# Patient Record
Sex: Female | Born: 2000 | Race: White | Hispanic: No | Marital: Single | Smoking: Never smoker
Health system: Southern US, Community
[De-identification: ages and names within clinical notes are randomized; demographics above are authoritative.]

## PROBLEM LIST (undated history)

## (undated) DIAGNOSIS — IMO0002 Reserved for concepts with insufficient information to code with codable children: Secondary | ICD-10-CM

## (undated) DIAGNOSIS — Z98811 Dental restoration status: Secondary | ICD-10-CM

## (undated) HISTORY — PX: TYMPANOSTOMY TUBE PLACEMENT: SHX32

## (undated) HISTORY — PX: ADENOIDECTOMY: SUR15

---
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
## 2018-09-16 ENCOUNTER — Other Ambulatory Visit: Payer: Self-pay | Admitting: Family Medicine

## 2018-09-16 ENCOUNTER — Ambulatory Visit
Admission: RE | Admit: 2018-09-16 | Discharge: 2018-09-16 | Disposition: A | Discharge status: Home / Self Care | Payer: BLUE CROSS/BLUE SHIELD | Source: Ambulatory Visit | Attending: Family Medicine | Admitting: Family Medicine

## 2018-09-16 DIAGNOSIS — R05 Cough: Secondary | ICD-10-CM | POA: Diagnosis not present

## 2018-09-16 DIAGNOSIS — R0602 Shortness of breath: Secondary | ICD-10-CM

## 2018-09-16 DIAGNOSIS — Z6823 Body mass index (BMI) 23.0-23.9, adult: Secondary | ICD-10-CM | POA: Diagnosis not present

## 2018-09-16 DIAGNOSIS — R509 Fever, unspecified: Secondary | ICD-10-CM

## 2018-09-16 DIAGNOSIS — Z9189 Other specified personal risk factors, not elsewhere classified: Secondary | ICD-10-CM | POA: Diagnosis not present

## 2018-09-16 DIAGNOSIS — J069 Acute upper respiratory infection, unspecified: Secondary | ICD-10-CM | POA: Diagnosis not present

## 2018-09-16 DIAGNOSIS — J189 Pneumonia, unspecified organism: Secondary | ICD-10-CM | POA: Diagnosis not present

## 2018-09-16 DIAGNOSIS — B2790 Infectious mononucleosis, unspecified without complication: Secondary | ICD-10-CM | POA: Diagnosis not present

## 2018-09-16 IMAGING — DX DG CHEST 2V
2 series · 2 of 2 positions shown · non-contrast
Comparison: [DATE]

CLINICAL DATA: Shortness of breath with cough

EXAM:
CHEST - 2 VIEW

[dg chest 2 view (1 of 2)]
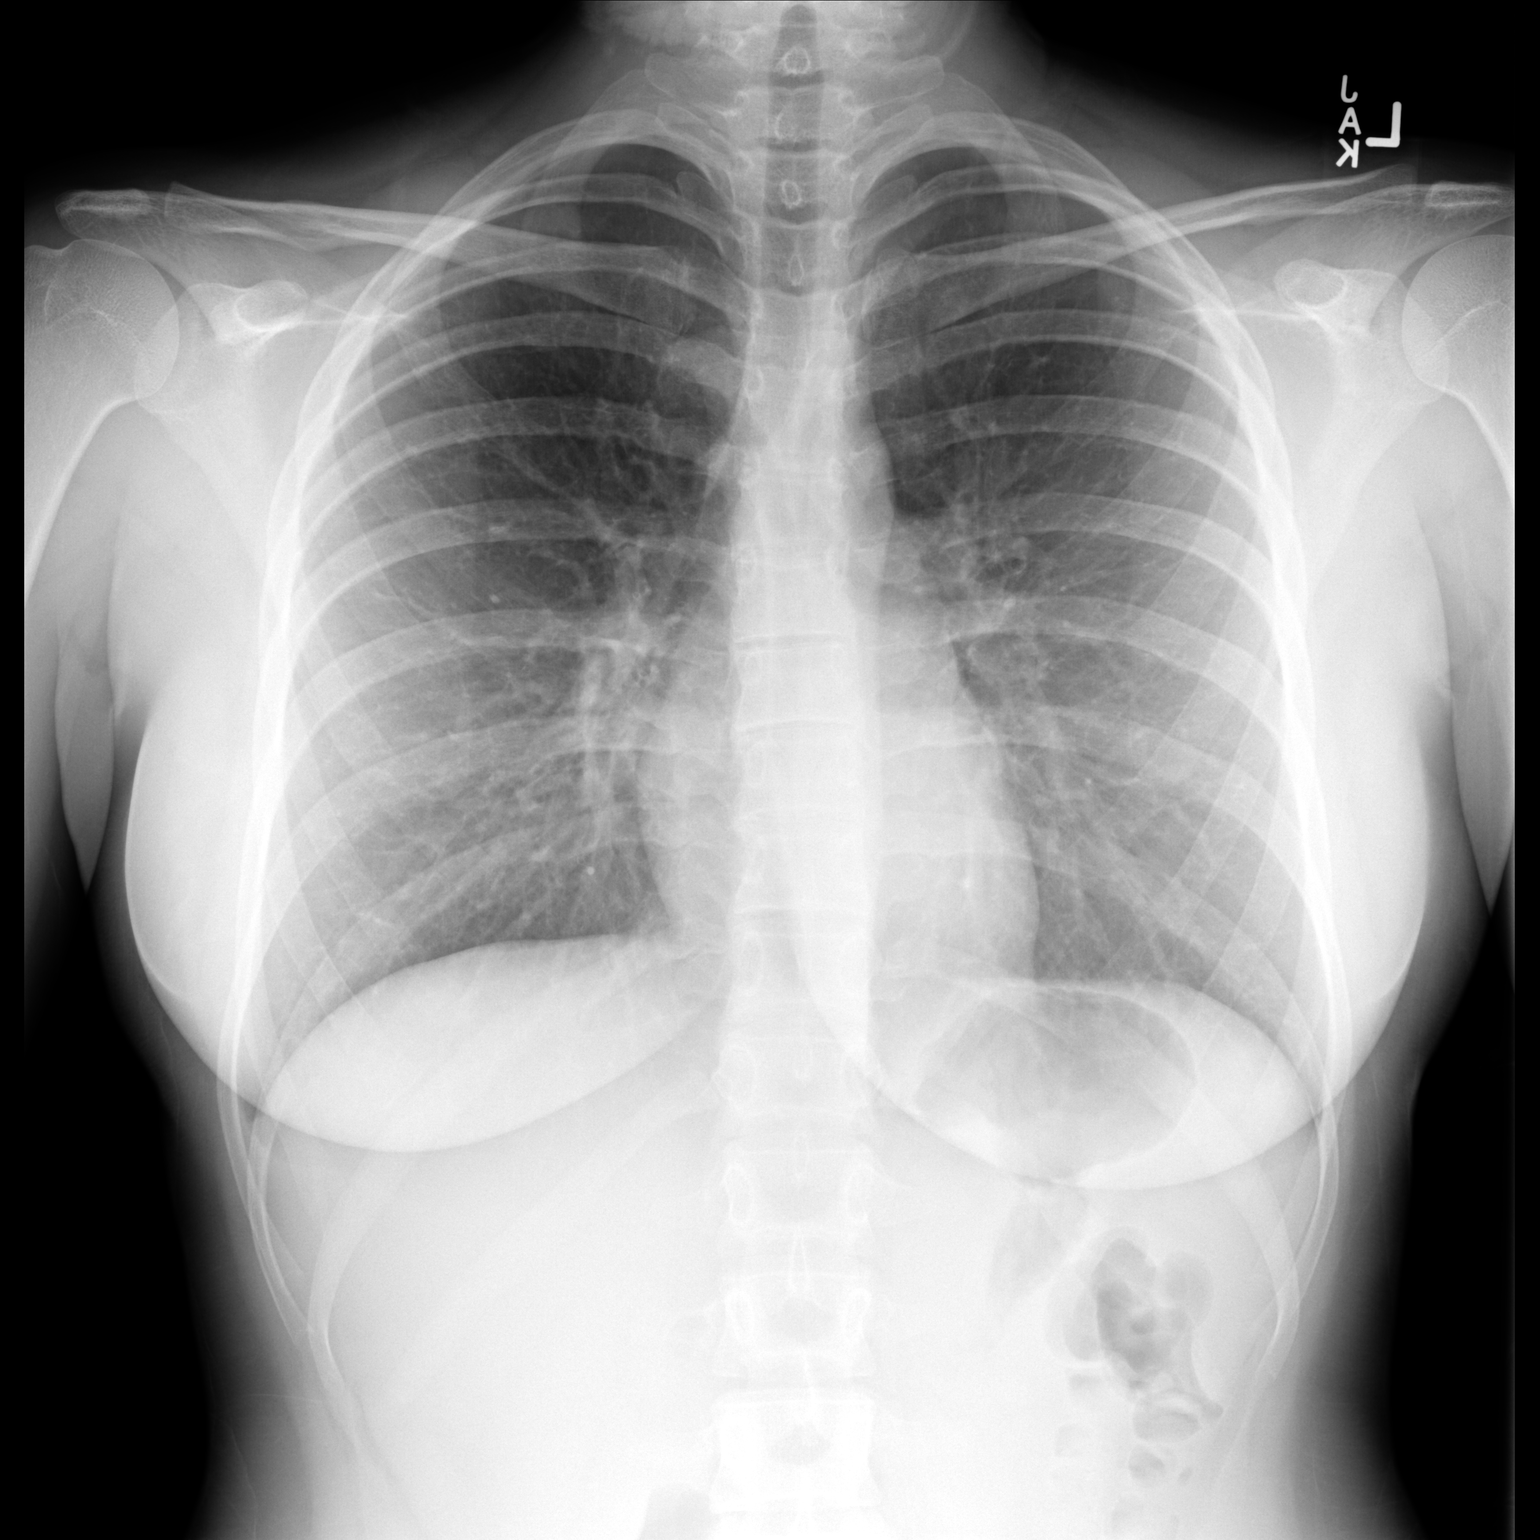

[dg chest 2 view (2 of 2)]
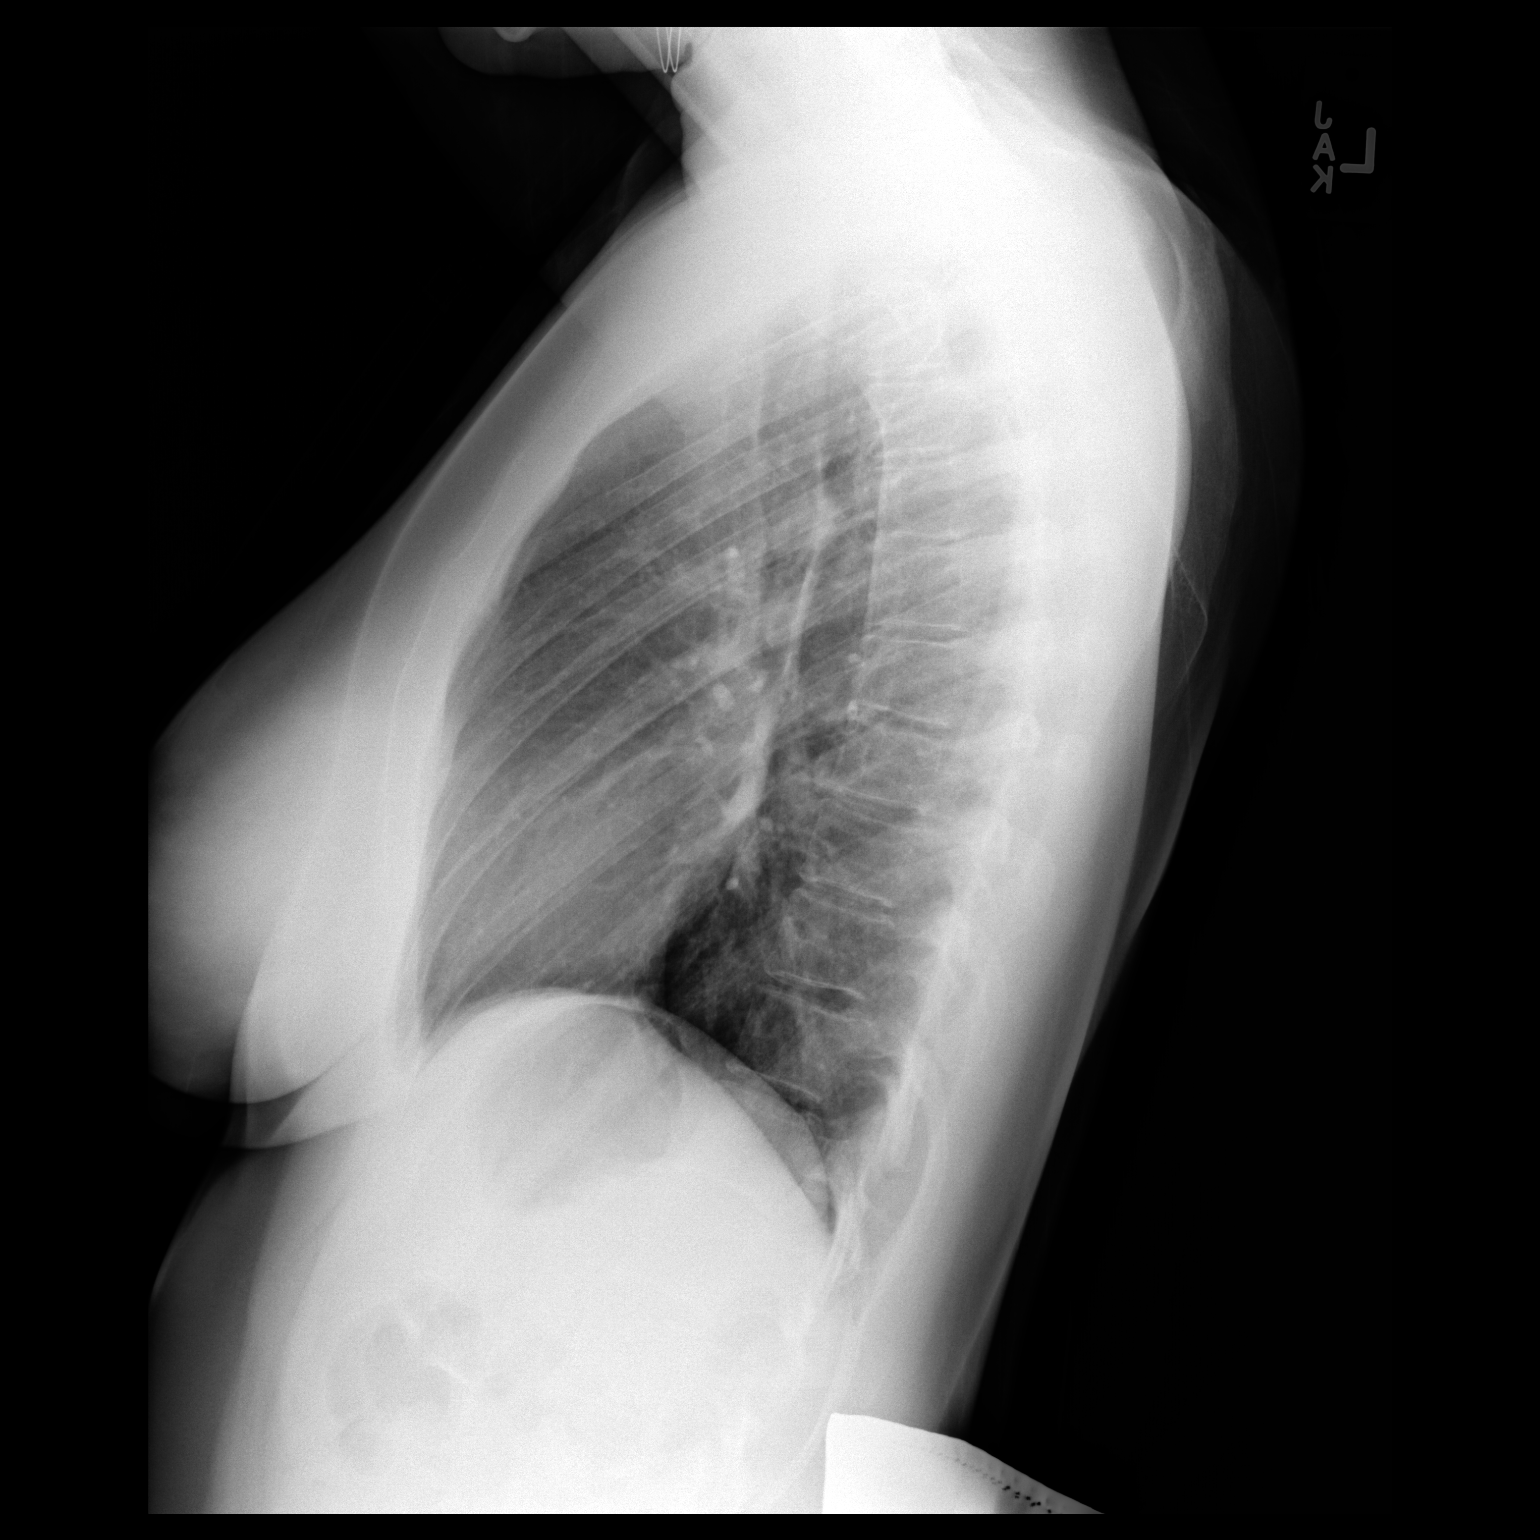

[2 of 2 positions shown; findings below may reference images not displayed]

FINDINGS: The lungs are clear. The heart size and pulmonary vascularity are
normal. No adenopathy. No evident bone lesions.
IMPRESSION: No edema or consolidation.

## 2018-09-16 MED ORDER — IOPAMIDOL (ISOVUE-370) INJECTION 76%
75.0000 mL | Freq: Once | INTRAVENOUS | Status: AC | PRN
  Administered 2018-09-16: 75 mL via INTRAVENOUS

## 2018-09-18 DIAGNOSIS — Z793 Long term (current) use of hormonal contraceptives: Secondary | ICD-10-CM | POA: Diagnosis not present

## 2018-09-18 DIAGNOSIS — B360 Pityriasis versicolor: Secondary | ICD-10-CM | POA: Diagnosis not present

## 2018-09-18 DIAGNOSIS — J189 Pneumonia, unspecified organism: Secondary | ICD-10-CM | POA: Diagnosis not present

## 2018-09-18 DIAGNOSIS — R5383 Other fatigue: Secondary | ICD-10-CM | POA: Diagnosis not present

## 2018-09-22 DIAGNOSIS — J189 Pneumonia, unspecified organism: Secondary | ICD-10-CM | POA: Diagnosis not present

## 2018-09-22 DIAGNOSIS — Z6823 Body mass index (BMI) 23.0-23.9, adult: Secondary | ICD-10-CM | POA: Diagnosis not present

## 2019-09-24 DIAGNOSIS — Z283 Underimmunization status: Secondary | ICD-10-CM | POA: Diagnosis not present

## 2019-09-24 DIAGNOSIS — L709 Acne, unspecified: Secondary | ICD-10-CM | POA: Diagnosis not present

## 2019-09-24 DIAGNOSIS — Z793 Long term (current) use of hormonal contraceptives: Secondary | ICD-10-CM | POA: Diagnosis not present

## 2022-12-16 NOTE — L&D Delivery Note (Signed)
Delivery Note At 10:25 AM a viable female was delivered via Vaginal, Spontaneous (Presentation:   Occiput Anterior).  APGAR: 8, 9; weight pending.  Placenta status: Spontaneous, Intact.  Cord: 3 vessels with the following complications: loose nuchal x 1; reduced.  Cord pH: n/a  Anesthesia: Epidural Episiotomy:  n/a Lacerations:  bilateral periurethral and vaginal Suture Repair: 3.0 vicryl rapide Est. Blood Loss (mL):  211  Mom to postpartum.  Baby to Couplet care / Skin to Skin.  Stacey Avila 12/02/2023, 10:46 AM

## 2023-05-16 LAB — OB RESULTS CONSOLE HEPATITIS B SURFACE ANTIGEN: Hepatitis B Surface Ag: NEGATIVE

## 2023-05-16 LAB — OB RESULTS CONSOLE RUBELLA ANTIBODY, IGM: Rubella: IMMUNE

## 2023-05-16 LAB — OB RESULTS CONSOLE HIV ANTIBODY (ROUTINE TESTING): HIV: NONREACTIVE

## 2023-05-19 LAB — HEPATITIS C ANTIBODY: HCV Ab: NEGATIVE

## 2023-06-02 LAB — OB RESULTS CONSOLE GC/CHLAMYDIA
Chlamydia: NEGATIVE
Neisseria Gonorrhea: NEGATIVE

## 2023-09-24 LAB — OB RESULTS CONSOLE RPR: RPR: NONREACTIVE

## 2023-09-24 LAB — OB RESULTS CONSOLE HIV ANTIBODY (ROUTINE TESTING): HIV: NONREACTIVE

## 2023-11-16 LAB — OB RESULTS CONSOLE GC/CHLAMYDIA
Chlamydia: NEGATIVE
Neisseria Gonorrhea: NEGATIVE

## 2023-11-28 NOTE — H&P (Signed)
Stacey Avila is a 22 y.o. female presenting for IOL s/s gestational HTN. Bps 140s/90s in the office x 2.  OB History   No obstetric history on file.    Past Medical History:  Diagnosis Date   Dental crowns present    Foreign body 09/2012   left hand   Past Surgical History:  Procedure Laterality Date   ADENOIDECTOMY     TYMPANOSTOMY TUBE PLACEMENT     x 2-3   Family History: family history includes Diabetes in her paternal grandfather; Heart disease in her maternal grandmother; Hypertension in her maternal grandmother; Hypothyroidism in her mother. Social History:  reports that she has never smoked. She has never used smokeless tobacco. She reports that she does not drink alcohol and does not use drugs.     Maternal Diabetes: No Genetic Screening: Normal Maternal Ultrasounds/Referrals: Normal Fetal Ultrasounds or other Referrals:  None Maternal Substance Abuse:  No Significant Maternal Medications:  None Significant Maternal Lab Results:  None Number of Prenatal Visits:greater than 3 verified prenatal visits   Review of Systems History   There were no vitals taken for this visit. Exam Physical Exam  (from office) NAD, A&O NWOB Abd soft, nondistended, gravid  Prenatal labs: ABO, Rh:   Antibody:   Rubella:   RPR:    HBsAg:    HIV:    GBS:     Assessment/Plan: 22 yo G1P0 @ 28 wga presenting for IOL s/s gHTN without severe features. Cervix unfavorable. Plan for cytotec followed by pitocin/AROM when more favorable.  GBS unknown - pending. Will go ahead and treat.    Madelaine Etienne Annette Liotta 11/28/2023, 1:02 PM

## 2023-12-01 ENCOUNTER — Inpatient Hospital Stay (HOSPITAL_COMMUNITY)
Admission: RE | Admit: 2023-12-01 | Discharge: 2023-12-03 | DRG: 807 | Disposition: A | Discharge status: Home / Self Care | Payer: BC Managed Care – PPO | Attending: Obstetrics & Gynecology | Admitting: Obstetrics & Gynecology

## 2023-12-01 ENCOUNTER — Inpatient Hospital Stay (HOSPITAL_COMMUNITY): Admission: RE | Admit: 2023-12-01 | Payer: BC Managed Care – PPO | Source: Ambulatory Visit

## 2023-12-01 ENCOUNTER — Encounter (HOSPITAL_COMMUNITY): Payer: Self-pay

## 2023-12-01 ENCOUNTER — Other Ambulatory Visit: Payer: Self-pay

## 2023-12-01 DIAGNOSIS — Z3A37 37 weeks gestation of pregnancy: Secondary | ICD-10-CM | POA: Diagnosis not present

## 2023-12-01 DIAGNOSIS — Z3490 Encounter for supervision of normal pregnancy, unspecified, unspecified trimester: Principal | ICD-10-CM

## 2023-12-01 DIAGNOSIS — O134 Gestational [pregnancy-induced] hypertension without significant proteinuria, complicating childbirth: Principal | ICD-10-CM | POA: Diagnosis present

## 2023-12-01 DIAGNOSIS — Z833 Family history of diabetes mellitus: Secondary | ICD-10-CM | POA: Diagnosis not present

## 2023-12-01 DIAGNOSIS — Z8249 Family history of ischemic heart disease and other diseases of the circulatory system: Secondary | ICD-10-CM

## 2023-12-01 LAB — CBC
HCT: 32.5 % — ABNORMAL LOW (ref 36.0–46.0)
Hemoglobin: 10.4 g/dL — ABNORMAL LOW (ref 12.0–15.0)
MCH: 23.2 pg — ABNORMAL LOW (ref 26.0–34.0)
MCHC: 32 g/dL (ref 30.0–36.0)
MCV: 72.5 fL — ABNORMAL LOW (ref 80.0–100.0)
Platelets: 297 10*3/uL (ref 150–400)
RBC: 4.48 MIL/uL (ref 3.87–5.11)
RDW: 14.9 % (ref 11.5–15.5)
WBC: 8 10*3/uL (ref 4.0–10.5)
nRBC: 0 % (ref 0.0–0.2)

## 2023-12-01 LAB — OB RESULTS CONSOLE GBS: GBS: NEGATIVE

## 2023-12-01 LAB — RPR: RPR Ser Ql: NONREACTIVE

## 2023-12-01 LAB — ABO/RH: ABO/RH(D): O POS

## 2023-12-01 LAB — HIV ANTIBODY (ROUTINE TESTING W REFLEX): HIV Screen 4th Generation wRfx: NONREACTIVE

## 2023-12-01 MED ORDER — LIDOCAINE HCL (PF) 1 % IJ SOLN: 30.0000 mL | INTRAMUSCULAR | Status: DC | PRN

## 2023-12-01 MED ORDER — MISOPROSTOL 25 MCG QUARTER TABLET: 25.0000 ug | ORAL_TABLET | Freq: Once | ORAL | Status: DC

## 2023-12-01 MED ORDER — TERBUTALINE SULFATE 1 MG/ML IJ SOLN: 0.2500 mg | Freq: Once | INTRAMUSCULAR | Status: DC | PRN

## 2023-12-01 MED ORDER — ACETAMINOPHEN 325 MG PO TABS: 650.0000 mg | ORAL_TABLET | ORAL | Status: DC | PRN

## 2023-12-01 MED ORDER — OXYTOCIN-SODIUM CHLORIDE 30-0.9 UT/500ML-% IV SOLN
1.0000 m[IU]/min | INTRAVENOUS | Status: DC
  Administered 2023-12-02: 2 m[IU]/min via INTRAVENOUS
  Filled 2023-12-01: qty 500

## 2023-12-01 MED ORDER — PENICILLIN G POT IN DEXTROSE 60000 UNIT/ML IV SOLN: 3.0000 10*6.[IU] | INTRAVENOUS | Status: DC

## 2023-12-01 MED ORDER — SOD CITRATE-CITRIC ACID 500-334 MG/5ML PO SOLN: 30.0000 mL | ORAL | Status: DC | PRN

## 2023-12-01 MED ORDER — LACTATED RINGERS IV SOLN: 500.0000 mL | INTRAVENOUS | Status: AC | PRN

## 2023-12-01 MED ORDER — SODIUM CHLORIDE 0.9 % IV SOLN
5.0000 10*6.[IU] | Freq: Once | INTRAVENOUS | Status: AC
  Administered 2023-12-01: 5 10*6.[IU] via INTRAVENOUS
  Filled 2023-12-01: qty 5

## 2023-12-01 MED ORDER — OXYTOCIN BOLUS FROM INFUSION
333.0000 mL | Freq: Once | INTRAVENOUS | Status: AC
  Administered 2023-12-02: 333 mL via INTRAVENOUS

## 2023-12-01 MED ORDER — OXYCODONE-ACETAMINOPHEN 5-325 MG PO TABS: 1.0000 | ORAL_TABLET | ORAL | Status: DC | PRN

## 2023-12-01 MED ORDER — OXYCODONE-ACETAMINOPHEN 5-325 MG PO TABS: 2.0000 | ORAL_TABLET | ORAL | Status: DC | PRN

## 2023-12-01 MED ORDER — ONDANSETRON HCL 4 MG/2ML IJ SOLN: 4.0000 mg | Freq: Four times a day (QID) | INTRAMUSCULAR | Status: DC | PRN

## 2023-12-01 MED ORDER — HYDROXYZINE HCL 50 MG PO TABS: 50.0000 mg | ORAL_TABLET | Freq: Four times a day (QID) | ORAL | Status: DC | PRN

## 2023-12-01 MED ORDER — ZOLPIDEM TARTRATE 5 MG PO TABS: 5.0000 mg | ORAL_TABLET | Freq: Every evening | ORAL | Status: DC | PRN

## 2023-12-01 MED ORDER — OXYTOCIN-SODIUM CHLORIDE 30-0.9 UT/500ML-% IV SOLN
2.5000 [IU]/h | INTRAVENOUS | Status: DC
  Administered 2023-12-02: 2.5 [IU]/h via INTRAVENOUS

## 2023-12-01 MED ORDER — LACTATED RINGERS IV SOLN: INTRAVENOUS | Status: AC

## 2023-12-01 MED ORDER — MISOPROSTOL 25 MCG QUARTER TABLET
25.0000 ug | ORAL_TABLET | ORAL | Status: DC | PRN
  Administered 2023-12-01 (×4): 25 ug via VAGINAL
  Filled 2023-12-01 (×5): qty 1

## 2023-12-01 MED ORDER — FENTANYL CITRATE (PF) 100 MCG/2ML IJ SOLN: 50.0000 ug | INTRAMUSCULAR | Status: DC | PRN

## 2023-12-01 NOTE — Progress Notes (Signed)
Patient arrived on unit for induction of labor. Vitals WNL, IV placed, labs drawn, and NST done. MD Grewal called at 0510 to update provider of patients arrival and status. Patient requests not to have a cervical exam due to previous exam she was 0.5cm on 11/26/23. Patient requests to walk around unit. Verbal order for intermittent fetal monitoring while patient is walking. Once patient returns to room, IV pitocin 2X2 for induction. Plan of care explained to patient, patient verbalized understanding, no questions or concerns at this time from patient.

## 2023-12-01 NOTE — Progress Notes (Signed)
Continues to feel contractions getting more intense.  Sve 1.6/60/-2  Continue IOL

## 2023-12-01 NOTE — Progress Notes (Signed)
Reports doing well. Denies s/s severe PIH including vision changes/HA/SOB.  Exam reassuring - SVE 0.5/thick/-3 per RN, vertex at [phone]. Pitocin not started s/s unfavorable cervix.  S/p cytotec x 1.  D/w pt labor plan which includes cytotec q 4 hours until cervix more favorable. Then transition to pitocin and AROM when able. Patient and husband understand and accept plan.   Rosie Fate MD

## 2023-12-01 NOTE — Progress Notes (Signed)
Doing well. Starting to feel slightly more uncomfortable with contractions.  S/p cytotec x 3.  Will evaluate at next check.

## 2023-12-02 ENCOUNTER — Encounter (HOSPITAL_COMMUNITY): Payer: Self-pay | Admitting: Obstetrics & Gynecology

## 2023-12-02 ENCOUNTER — Inpatient Hospital Stay (HOSPITAL_COMMUNITY): Payer: BC Managed Care – PPO | Admitting: Anesthesiology

## 2023-12-02 LAB — CBC
HCT: 32.7 % — ABNORMAL LOW (ref 36.0–46.0)
Hemoglobin: 10.2 g/dL — ABNORMAL LOW (ref 12.0–15.0)
MCH: 22.9 pg — ABNORMAL LOW (ref 26.0–34.0)
MCHC: 31.2 g/dL (ref 30.0–36.0)
MCV: 73.3 fL — ABNORMAL LOW (ref 80.0–100.0)
Platelets: 273 10*3/uL (ref 150–400)
RBC: 4.46 MIL/uL (ref 3.87–5.11)
RDW: 15 % (ref 11.5–15.5)
WBC: 11.7 10*3/uL — ABNORMAL HIGH (ref 4.0–10.5)
nRBC: 0 % (ref 0.0–0.2)

## 2023-12-02 MED ORDER — ZOLPIDEM TARTRATE 5 MG PO TABS: 5.0000 mg | ORAL_TABLET | Freq: Every evening | ORAL | Status: DC | PRN

## 2023-12-02 MED ORDER — SENNOSIDES-DOCUSATE SODIUM 8.6-50 MG PO TABS
2.0000 | ORAL_TABLET | Freq: Every day | ORAL | Status: DC
  Administered 2023-12-03: 2 via ORAL
  Filled 2023-12-02: qty 2

## 2023-12-02 MED ORDER — TETANUS-DIPHTH-ACELL PERTUSSIS 5-2.5-18.5 LF-MCG/0.5 IM SUSY: 0.5000 mL | PREFILLED_SYRINGE | Freq: Once | INTRAMUSCULAR | Status: DC

## 2023-12-02 MED ORDER — SIMETHICONE 80 MG PO CHEW: 80.0000 mg | CHEWABLE_TABLET | ORAL | Status: DC | PRN

## 2023-12-02 MED ORDER — LIDOCAINE-EPINEPHRINE (PF) 2 %-1:200000 IJ SOLN
INTRAMUSCULAR | Status: DC | PRN
  Administered 2023-12-02: 5 mL via EPIDURAL

## 2023-12-02 MED ORDER — EPHEDRINE 5 MG/ML INJ: 10.0000 mg | INTRAVENOUS | Status: DC | PRN

## 2023-12-02 MED ORDER — ACETAMINOPHEN 325 MG PO TABS: 650.0000 mg | ORAL_TABLET | ORAL | Status: DC | PRN

## 2023-12-02 MED ORDER — PHENYLEPHRINE 80 MCG/ML (10ML) SYRINGE FOR IV PUSH (FOR BLOOD PRESSURE SUPPORT): 80.0000 ug | PREFILLED_SYRINGE | INTRAVENOUS | Status: DC | PRN

## 2023-12-02 MED ORDER — FENTANYL-BUPIVACAINE-NACL 0.5-0.125-0.9 MG/250ML-% EP SOLN
12.0000 mL/h | EPIDURAL | Status: DC | PRN
  Administered 2023-12-02: 12 mL/h via EPIDURAL
  Filled 2023-12-02: qty 250

## 2023-12-02 MED ORDER — LACTATED RINGERS IV SOLN: 500.0000 mL | Freq: Once | INTRAVENOUS | Status: DC

## 2023-12-02 MED ORDER — FERROUS SULFATE 325 (65 FE) MG PO TABS
325.0000 mg | ORAL_TABLET | Freq: Every day | ORAL | Status: DC
  Administered 2023-12-03: 325 mg via ORAL
  Filled 2023-12-02: qty 1

## 2023-12-02 MED ORDER — DIPHENHYDRAMINE HCL 25 MG PO CAPS: 25.0000 mg | ORAL_CAPSULE | Freq: Four times a day (QID) | ORAL | Status: DC | PRN

## 2023-12-02 MED ORDER — IBUPROFEN 600 MG PO TABS
600.0000 mg | ORAL_TABLET | Freq: Four times a day (QID) | ORAL | Status: DC
  Filled 2023-12-02 (×2): qty 1

## 2023-12-02 MED ORDER — DIPHENHYDRAMINE HCL 50 MG/ML IJ SOLN: 12.5000 mg | INTRAMUSCULAR | Status: DC | PRN

## 2023-12-02 MED ORDER — BENZOCAINE-MENTHOL 20-0.5 % EX AERO: 1.0000 | INHALATION_SPRAY | CUTANEOUS | Status: DC | PRN

## 2023-12-02 MED ORDER — DIBUCAINE (PERIANAL) 1 % EX OINT: 1.0000 | TOPICAL_OINTMENT | CUTANEOUS | Status: DC | PRN

## 2023-12-02 MED ORDER — COCONUT OIL OIL: 1.0000 | TOPICAL_OIL | Status: DC | PRN

## 2023-12-02 MED ORDER — PRENATAL MULTIVITAMIN CH
1.0000 | ORAL_TABLET | Freq: Every day | ORAL | Status: DC
  Filled 2023-12-02 (×2): qty 1

## 2023-12-02 MED ORDER — BUPIVACAINE HCL (PF) 0.25 % IJ SOLN
INTRAMUSCULAR | Status: DC | PRN
  Administered 2023-12-02 (×2): 5 mL via EPIDURAL

## 2023-12-02 MED ORDER — ONDANSETRON HCL 4 MG PO TABS
4.0000 mg | ORAL_TABLET | ORAL | Status: DC | PRN
Start: 2023-12-02 — End: 2023-12-03

## 2023-12-02 MED ORDER — WITCH HAZEL-GLYCERIN EX PADS: 1.0000 | MEDICATED_PAD | CUTANEOUS | Status: DC | PRN

## 2023-12-02 MED ORDER — ONDANSETRON HCL 4 MG/2ML IJ SOLN
4.0000 mg | INTRAMUSCULAR | Status: DC | PRN
Start: 2023-12-02 — End: 2023-12-03

## 2023-12-02 NOTE — Progress Notes (Signed)
Stacey Avila is a 22 y.o. G1P0 at [redacted]w[redacted]d by ultrasound admitted for induction of labor due to [facility].  Subjective: Comfortable with CLEA.  No HA, vision change, RUQ pain, CP/SOB.  Objective: BP 130/83   Pulse 66   Temp 97.7 F (36.5 C) (Oral)   Resp 16   Ht 5\' 6"  (1.676 m)   Wt 81.6 kg   LMP 03/17/2023   SpO2 99%   BMI 29.04 kg/m  No intake/output data recorded. No intake/output data recorded.  FHT:  FHR: 140 bpm, variability: moderate,  accelerations:  Present,  decelerations:  Absent UC:   regular, every 3 minutes SVE:   Dilation: 6 Effacement (%): 90 Station: 0 Exam by:: Dr. Langston Masker  Labs: Lab Results  Component Value Date   WBC 11.7 (H) 12/02/2023   HGB 10.2 (L) 12/02/2023   HCT 32.7 (L) 12/02/2023   MCV 73.3 (L) 12/02/2023   PLT 273 12/02/2023    Assessment / Plan: Induction of labor due to gestational hypertension,  progressing well on pitocin  Labor: Progressing normally and will increase pitocin to 8 mU.  Will try repositioning; per RN, baby does not tolerate lateral positions.  If intolerant, return to back Preeclampsia:   n/a Fetal Wellbeing:  Category I Pain Control:  Epidural I/D:  n/a Anticipated MOD:  NSVD  Mitchel Honour, DO 12/02/2023, 8:10 AM

## 2023-12-02 NOTE — Progress Notes (Signed)
Doing well. Now comf with cle.  Right after cle fht with some occ lates but resolved with position change.  Good var. pos scalp stun.  Sve 5/80/-2, Arom clear fluid.  Continue pitocin. Progressing well.

## 2023-12-02 NOTE — Anesthesia Procedure Notes (Signed)
Epidural Patient location during procedure: OB Start time: 12/02/2023 5:29 AM End time: 12/02/2023 5:38 AM  Staffing Anesthesiologist: Mariann Barter, MD Performed: anesthesiologist   Preanesthetic Checklist Completed: patient identified, IV checked, site marked, risks and benefits discussed, surgical consent, monitors and equipment checked, pre-op evaluation and timeout performed  Epidural Patient position: sitting Prep: DuraPrep Patient monitoring: heart rate, continuous pulse ox and blood pressure Approach: midline Location: L4-L5 Injection technique: LOR saline  Needle:  Needle type: Tuohy  Needle gauge: 17 G Needle length: 9 cm Needle insertion depth: 6 cm Catheter type: closed end flexible Catheter size: 19 Gauge Catheter at skin depth: 11 cm Test dose: negative and 1.5% lidocaine with Epi 1:200 K  Assessment Events: blood not aspirated, no cerebrospinal fluid, injection not painful, no injection resistance, no paresthesia and negative IV test  Additional Notes Reason for block:procedure for pain

## 2023-12-02 NOTE — Anesthesia Preprocedure Evaluation (Signed)
Anesthesia Evaluation  Patient identified by MRN, date of birth, ID band Patient awake    Reviewed: Allergy & Precautions, NPO status , Patient's Chart, lab work & pertinent test results, reviewed documented beta blocker date and time   History of Anesthesia Complications Negative for: history of anesthetic complications  Airway Mallampati: II  TM Distance: >3 FB     Dental no notable dental hx.    Pulmonary neg COPD   breath sounds clear to auscultation       Cardiovascular hypertension (gest HTN), (-) angina (-) CAD and (-) Past MI  Rhythm:Regular Rate:Normal     Neuro/Psych neg Seizures PSYCHIATRIC DISORDERS         GI/Hepatic ,neg GERD  ,,(+) neg Cirrhosis        Endo/Other  neg diabetes    Renal/GU Renal disease     Musculoskeletal   Abdominal   Peds  Hematology   Anesthesia Other Findings   Reproductive/Obstetrics (+) Pregnancy                              Anesthesia Physical Anesthesia Plan  ASA: 2  Anesthesia Plan: Epidural   Post-op Pain Management:    Induction: Intravenous  PONV Risk Score and Plan: 1 and Ondansetron  Airway Management Planned:   Additional Equipment:   Intra-op Plan:   Post-operative Plan:   Informed Consent: I have reviewed the patients History and Physical, chart, labs and discussed the procedure including the risks, benefits and alternatives for the proposed anesthesia with the patient or authorized representative who has indicated his/her understanding and acceptance.       Plan Discussed with:   Anesthesia Plan Comments:          Anesthesia Quick Evaluation

## 2023-12-03 LAB — CBC
HCT: 29.9 % — ABNORMAL LOW (ref 36.0–46.0)
Hemoglobin: 9.3 g/dL — ABNORMAL LOW (ref 12.0–15.0)
MCH: 22.9 pg — ABNORMAL LOW (ref 26.0–34.0)
MCHC: 31.1 g/dL (ref 30.0–36.0)
MCV: 73.6 fL — ABNORMAL LOW (ref 80.0–100.0)
Platelets: 243 10*3/uL (ref 150–400)
RBC: 4.06 MIL/uL (ref 3.87–5.11)
RDW: 15.1 % (ref 11.5–15.5)
WBC: 11.3 10*3/uL — ABNORMAL HIGH (ref 4.0–10.5)
nRBC: 0 % (ref 0.0–0.2)

## 2023-12-03 MED ORDER — IBUPROFEN 600 MG PO TABS: 600.0000 mg | ORAL_TABLET | Freq: Four times a day (QID) | ORAL | 0 refills | Status: AC

## 2023-12-03 MED ORDER — DOCUSATE SODIUM 100 MG PO CAPS: 100.0000 mg | ORAL_CAPSULE | Freq: Every day | ORAL | 0 refills | Status: AC

## 2023-12-03 MED ORDER — FERROUS SULFATE 325 (65 FE) MG PO TABS: 325.0000 mg | ORAL_TABLET | Freq: Every day | ORAL | 0 refills | Status: AC

## 2023-12-03 MED ORDER — ACETAMINOPHEN 325 MG PO TABS: 650.0000 mg | ORAL_TABLET | ORAL | 0 refills | Status: AC | PRN

## 2023-12-03 NOTE — Anesthesia Postprocedure Evaluation (Signed)
Anesthesia Post Note  Patient: Stacey Avila  Procedure(s) Performed: AN AD HOC LABOR EPIDURAL     Patient location during evaluation: Mother Baby Anesthesia Type: Epidural Level of consciousness: awake and alert Pain management: pain level controlled Vital Signs Assessment: post-procedure vital signs reviewed and stable Respiratory status: spontaneous breathing, nonlabored ventilation and respiratory function stable Cardiovascular status: stable Postop Assessment: no headache, no backache and epidural receding Anesthetic complications: no   No notable events documented.  Last Vitals:  Vitals:   12/03/23 0156 12/03/23 0554  BP: 113/76 112/68  Pulse: 76 65  Resp: 18 18  Temp: 36.7 C 36.8 C  SpO2: 99% 100%    Last Pain:  Vitals:   12/03/23 0554  TempSrc: Oral  PainSc: 0-No pain   Pain Goal:                   Emie Sommerfeld

## 2023-12-03 NOTE — Progress Notes (Signed)
Postpartum Progress Note  Post Partum Day 1 s/p spontaneous vaginal delivery.  Patient reports well-controlled pain, ambulating without difficulty, voiding spontaneously, tolerating PO.  Vaginal bleeding is appropriate.   Objective: Blood pressure 112/68, pulse 65, temperature 98.2 F (36.8 C), temperature source Oral, resp. rate 18, height 5\' 6"  (1.676 m), weight 81.6 kg, last menstrual period 03/17/2023, SpO2 100%, unknown if currently breastfeeding.  Physical Exam:  General: alert and no distress Lochia: appropriate Uterine Fundus: firm DVT Evaluation: No evidence of DVT seen on physical exam.  Recent Labs    12/02/23 0152 12/03/23 0316  HGB 10.2* 9.3*  HCT 32.7* 29.9*    Assessment/Plan: Postpartum Day 1, s/p vaginal delivery. gHTN - Well controlled since delivery, no current antihypertensives or symptoms of PIH Continue routine postpartum care Lactation following Baby boy - desires circ. Will perform if cleared by nursery.  Anticipate discharge home PPD#1 or #2   LOS: 2 days   Stacey Avila 12/03/2023, 7:38 AM

## 2023-12-03 NOTE — Discharge Summary (Signed)
Obstetric Discharge Summary  Stacey Avila is a 22 y.o. female that presented on 12/01/2023 for induction of labor for gestational hypertension.  She was admitted to labor and delivery for induction.  Her labor course was uncomplicated and she delivered a viable female infant on 12/02/2023.  Her postpartum course was uncomplicated and on PPD#1, she reported well controlled pain, spontaneous voiding, ambulating without difficulty, and tolerating PO. Blood pressures remained well controlled postpartum. She was stable for discharge home on 12/03/2023 with plans for in-office follow up.  Hemoglobin  Date Value Ref Range Status  12/03/2023 9.3 (L) 12.0 - 15.0 g/dL Final   HCT  Date Value Ref Range Status  12/03/2023 29.9 (L) 36.0 - 46.0 % Final    Physical Exam:  General: alert and no distress Lochia: appropriate Uterine Fundus: firm DVT Evaluation: No evidence of DVT seen on physical exam.  Discharge Diagnoses: Term Pregnancy-delivered  Discharge Information: Date: 12/03/2023 Activity: Pelvic rest, as tolerated Diet: routine Medications: Tylenol, motrin, iron sulfate, colace Condition: stable Instructions: Refer to practice specific booklet.  Discussed prior to discharge.  Discharge to: Home  Follow-up Information     [place] Follow up.   Why: Please follow up for a 1 week blood pressure check, then a 6 week postpartum visit. Contact information: [address] [place] [address] [phone]                 Newborn Data: Live born female  Birth Weight: 6 lb 3.1 oz (2810 g) APGAR: 8, 9  Newborn Delivery   Birth date/time: 12/02/2023 10:25:00 Delivery type: Vaginal, Spontaneous     Home with mother.  Lyn Henri 12/03/2023, 2:32 PM

## 2023-12-05 LAB — BPAM RBC
Blood Product Expiration Date: 202501062359
Blood Product Expiration Date: 202501062359
Unit Type and Rh: 5100
Unit Type and Rh: 5100

## 2023-12-05 LAB — TYPE AND SCREEN
ABO/RH(D): O POS
Antibody Screen: POSITIVE
Unit division: 0
Unit division: 0

## 2023-12-11 ENCOUNTER — Telehealth (HOSPITAL_COMMUNITY): Payer: Self-pay | Admitting: *Deleted

## 2023-12-11 NOTE — Telephone Encounter (Signed)
12/11/2023  Name: [name] MRN: [id number] DOB: 30-Nov-2001  Reason for Call:  Transition of Care Hospital Discharge Call  Contact Status: Patient Contact Status: Complete  Language assistant needed: Interpreter Mode: Interpreter Not Needed        Follow-Up Questions: Do You Have Any Concerns About Your Health As You Heal From Delivery?: No Do You Have Any Concerns About Your Infants Health?: No  Edinburgh Postnatal Depression Scale:  In the Past 7 Days: I have been able to laugh and see the funny side of things.: As much as I always could I have looked forward with enjoyment to things.: As much as I ever did I have blamed myself unnecessarily when things went wrong.: Not very often I have been anxious or worried for no good reason.: Hardly ever I have felt scared or panicky for no good reason.: No, not at all Things have been getting on top of me.: No, I have been coping as well as ever I have been so unhappy that I have had difficulty sleeping.: Not at all I have felt sad or miserable.: No, not at all I have been so unhappy that I have been crying.: No, never The thought of harming myself has occurred to me.: Never Inocente Salles Postnatal Depression Scale Total: 2  PHQ2-9 Depression Scale:     Discharge Follow-up: Edinburgh score requires follow up?: No Patient was advised of the following resources:: Breastfeeding Support Group, Support Group (declines postpartum group information via email)  Post-discharge interventions: Reviewed Newborn Safe Sleep Practices  Salena Saner, RN 12/11/2023 11:19
